# Patient Record
Sex: Female | Born: 1970 | Race: White | Hispanic: No | Marital: Married | Smoking: Never smoker
Health system: Southern US, Community
[De-identification: ages and names within clinical notes are randomized; demographics above are authoritative.]

## PROBLEM LIST (undated history)

## (undated) DIAGNOSIS — Z9889 Other specified postprocedural states: Secondary | ICD-10-CM

## (undated) DIAGNOSIS — F419 Anxiety disorder, unspecified: Secondary | ICD-10-CM

## (undated) DIAGNOSIS — S329XXA Fracture of unspecified parts of lumbosacral spine and pelvis, initial encounter for closed fracture: Secondary | ICD-10-CM

## (undated) DIAGNOSIS — I890 Lymphedema, not elsewhere classified: Secondary | ICD-10-CM

## (undated) HISTORY — PX: KNEE SURGERY: SHX244

## (undated) HISTORY — PX: ANTERIOR CRUCIATE LIGAMENT REPAIR: SHX115

---

## 2007-10-24 ENCOUNTER — Ambulatory Visit: Payer: Self-pay | Admitting: Family Medicine

## 2015-01-16 ENCOUNTER — Ambulatory Visit
Admission: EM | Admit: 2015-01-16 | Discharge: 2015-01-16 | Disposition: A | Discharge status: Home / Self Care | Payer: BLUE CROSS/BLUE SHIELD | Attending: Family Medicine | Admitting: Family Medicine

## 2015-01-16 ENCOUNTER — Encounter: Payer: Self-pay | Admitting: Gynecology

## 2015-01-16 DIAGNOSIS — S61219A Laceration without foreign body of unspecified finger without damage to nail, initial encounter: Secondary | ICD-10-CM | POA: Diagnosis not present

## 2015-01-16 HISTORY — DX: Fracture of unspecified parts of lumbosacral spine and pelvis, initial encounter for closed fracture: S32.9XXA

## 2015-01-16 HISTORY — DX: Anxiety disorder, unspecified: F41.9

## 2015-01-16 HISTORY — DX: Other specified postprocedural states: Z98.890

## 2015-01-16 NOTE — ED Notes (Signed)
Pt. C/o left pinky finger laceration x today while cutting a watermelon.

## 2015-01-16 NOTE — ED Provider Notes (Signed)
CSN: [id number]     Arrival date & time 01/16/15  1213 History   First MD Initiated Contact with Patient 01/16/15 1413     Chief Complaint  Patient presents with  . Laceration   (Consider location/radiation/quality/duration/timing/severity/associated sxs/prior Treatment) HPI 44 yo F lacerated left 5th digit with knife while cutting watermelon within the past hour. Tetanus up to date. Initially bled briskly but hemostasis now achieved, Good ROM, denies numbness or tingling distal  Past Medical History  Diagnosis Date  . Pelvis fracture   . Anxiety   . Hx of shoulder surgery     left   Past Surgical History  Procedure Laterality Date  . Knee surgery    . Cesarean section    . Anterior cruciate ligament repair     History reviewed. No pertinent family history. History  Substance Use Topics  . Smoking status: Never Smoker   . Smokeless tobacco: Not on file  . Alcohol Use: No   OB History    No data available     Review of Systems Constitutional -afebrile Eyes-denies visual changes ENT- normal voice,denies sore throat CV-denies chest pain Resp-denies SOB GI- negative for nausea,vomiting, diarrhea GU- negative for dysuria MSK- negative for back pain, ambulatory Skin- Laceration, distal lateral left 5th finger Neuro- negative headache,focal weakness or numbness    Allergies  Review of patient's allergies indicates no known allergies.  Home Medications   Prior to Admission medications   Medication Sig Start Date End Date Taking? Authorizing Provider  citalopram (CELEXA) 10 MG tablet Take 10 mg by mouth daily.   Yes Historical Provider, MD  loratadine (CLARITIN) 10 MG tablet Take 10 mg by mouth daily.   Yes Historical Provider, MD  norgestimate-ethinyl estradiol (ORTHO-CYCLEN,SPRINTEC,PREVIFEM) 0.25-35 MG-MCG tablet Take 1 tablet by mouth daily.   Yes Historical Provider, MD  ranitidine (ZANTAC) 75 MG tablet Take 75 mg by mouth 2 (two) times daily.   Yes Historical  Provider, MD   BP 117/66 mmHg  Pulse 61  Temp(Src) 98.1 F (36.7 C) (Oral)  Ht 5\' 7"  (1.702 m)  Wt 288 lb (130.636 kg)  BMI 45.10 kg/m2  SpO2 100%  LMP 01/05/2015 Physical Exam Constitutional -alert and oriented,well appearing Head-atraumatic Eyes- ,conjugate gaze Nose- no congestion or rhinorrhea Mouth/throat- mucous membranes moist , Neck- supple  CV- regular rate, grossly normal heart sounds, Resp-no distress, normal respiratory effort, GI- ,no distention GU- not examined MSK- ambulatory Neuro- normal speech and language,  Skin- laceration distal left fifth finger 1.5. Cm Psych; speech and behavior grossly normal  ED Course  Procedures (including critical care time) Labs Review Labs Reviewed - No data to display  Imaging Review No results found.   With patient permission her wound was examined and found to be superficial involving the skin only. 1.5 cm linear, smooth edges, Wound cleansed with copious saline and cholorhexidine-rinsed thoroughly. FROM, normal sensation, and strength. Dermabond closure  Well tolerated. Dressed in hug splint for protection  MDM   1. 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 Dermabond will wear off, then keep protective bandaid in place until fully healed.  Diagnosis and treatment discussed. . Questions fielded, expectations and recommendations reviewed. Patient expresses understanding. [facility], concern or exacerbation.     Rae HalstedLaurie W Lee, PA-C 01/17/15 [phone]

## 2015-01-17 ENCOUNTER — Encounter: Payer: Self-pay | Admitting: Physician Assistant

## 2019-04-30 ENCOUNTER — Emergency Department
Admission: EM | Admit: 2019-04-30 | Discharge: 2019-04-30 | Disposition: A | Discharge status: Home / Self Care | Payer: BC Managed Care – PPO | Attending: Emergency Medicine | Admitting: Emergency Medicine

## 2019-04-30 ENCOUNTER — Encounter: Payer: Self-pay | Admitting: *Deleted

## 2019-04-30 ENCOUNTER — Other Ambulatory Visit: Payer: Self-pay

## 2019-04-30 ENCOUNTER — Emergency Department: Payer: BC Managed Care – PPO

## 2019-04-30 DIAGNOSIS — S30811A Abrasion of abdominal wall, initial encounter: Secondary | ICD-10-CM | POA: Insufficient documentation

## 2019-04-30 DIAGNOSIS — M542 Cervicalgia: Secondary | ICD-10-CM | POA: Diagnosis present

## 2019-04-30 DIAGNOSIS — Y939 Activity, unspecified: Secondary | ICD-10-CM | POA: Diagnosis not present

## 2019-04-30 DIAGNOSIS — M7918 Myalgia, other site: Secondary | ICD-10-CM | POA: Diagnosis not present

## 2019-04-30 DIAGNOSIS — Y999 Unspecified external cause status: Secondary | ICD-10-CM | POA: Insufficient documentation

## 2019-04-30 DIAGNOSIS — Y92410 Unspecified street and highway as the place of occurrence of the external cause: Secondary | ICD-10-CM | POA: Insufficient documentation

## 2019-04-30 HISTORY — DX: Lymphedema, not elsewhere classified: I89.0

## 2019-04-30 MED ORDER — METAXALONE 800 MG PO TABS: 800.0000 mg | ORAL_TABLET | Freq: Three times a day (TID) | ORAL | 0 refills | Status: AC

## 2019-04-30 MED ORDER — KETOROLAC TROMETHAMINE 10 MG PO TABS: 10.0000 mg | ORAL_TABLET | Freq: Three times a day (TID) | ORAL | 0 refills | Status: AC

## 2019-04-30 MED ORDER — KETOROLAC TROMETHAMINE 30 MG/ML IJ SOLN
30.0000 mg | Freq: Once | INTRAMUSCULAR | Status: AC
  Administered 2019-04-30: 30 mg via INTRAMUSCULAR
  Filled 2019-04-30: qty 1
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
## 2019-04-30 NOTE — ED Notes (Signed)
ED Provider at bedside. 
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
## 2019-04-30 NOTE — ED Notes (Signed)
Patient transported to X-ray 
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
